# Patient Record
Sex: Male | Born: 1945 | Hispanic: No | Marital: Married
Health system: Southern US, Community
[De-identification: ages and names within clinical notes are randomized; demographics above are authoritative.]

## PROBLEM LIST (undated history)

## (undated) ENCOUNTER — Emergency Department (HOSPITAL_COMMUNITY): Discharge status: Against Medical Advice | Payer: Self-pay

---

## 2006-10-15 ENCOUNTER — Emergency Department (HOSPITAL_COMMUNITY): Admission: EM | Admit: 2006-10-15 | Discharge: 2006-10-15 | Payer: Self-pay | Admitting: Emergency Medicine

## 2008-01-10 ENCOUNTER — Inpatient Hospital Stay (HOSPITAL_COMMUNITY): Admission: EM | Admit: 2008-01-10 | Discharge: 2008-01-14 | Payer: Self-pay | Admitting: Emergency Medicine

## 2009-04-01 DEATH — deceased

## 2010-05-21 IMAGING — CR DG CHEST 2V SAME DAY
2 series · 2 of 2 positions shown · non-contrast
Comparison: 01/12/2008.

CLINICAL DATA: Shortness breath.  Fever.  End-stage renal disease.

CHEST - 2 VIEW SAME DAY

[w chest pa]
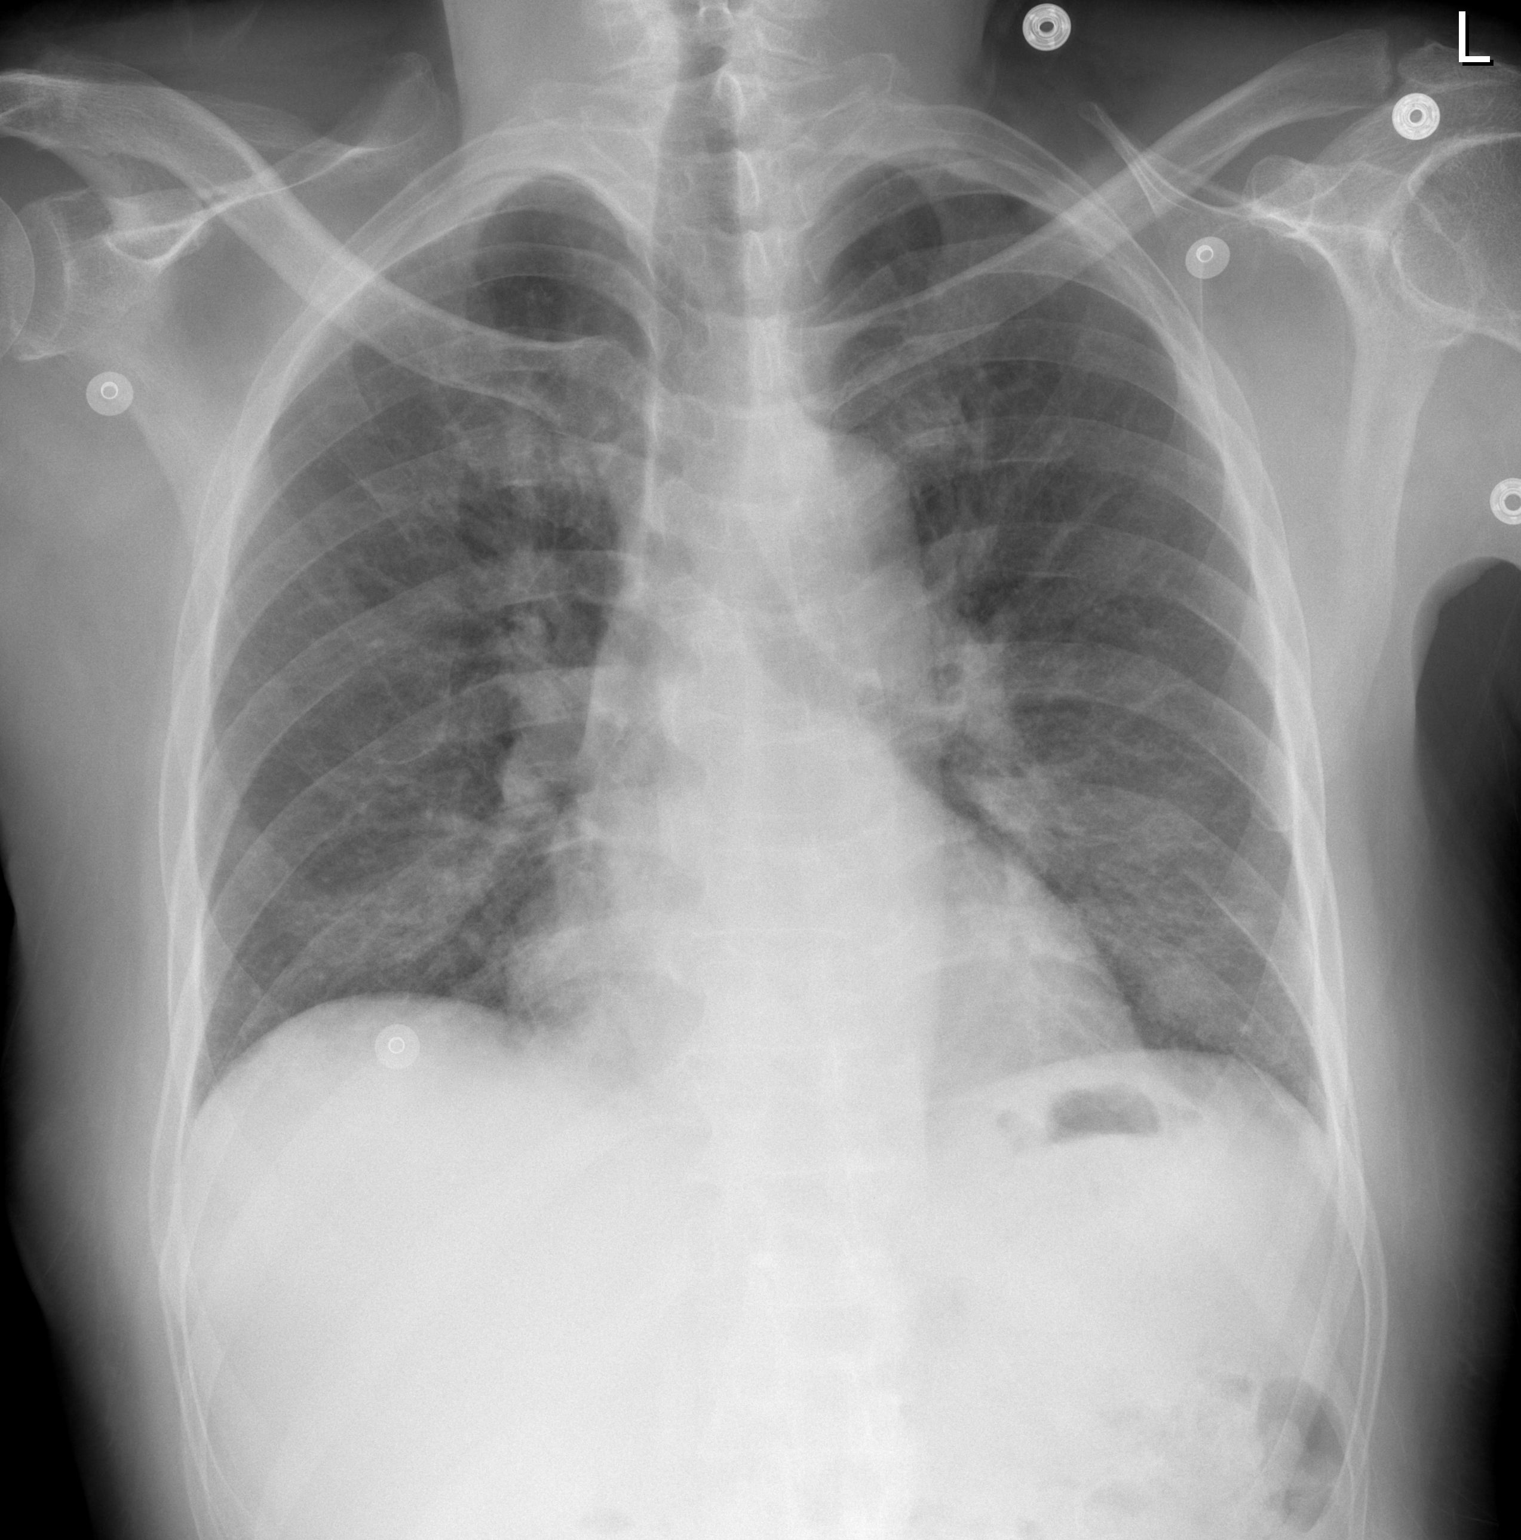

[w chest lat]
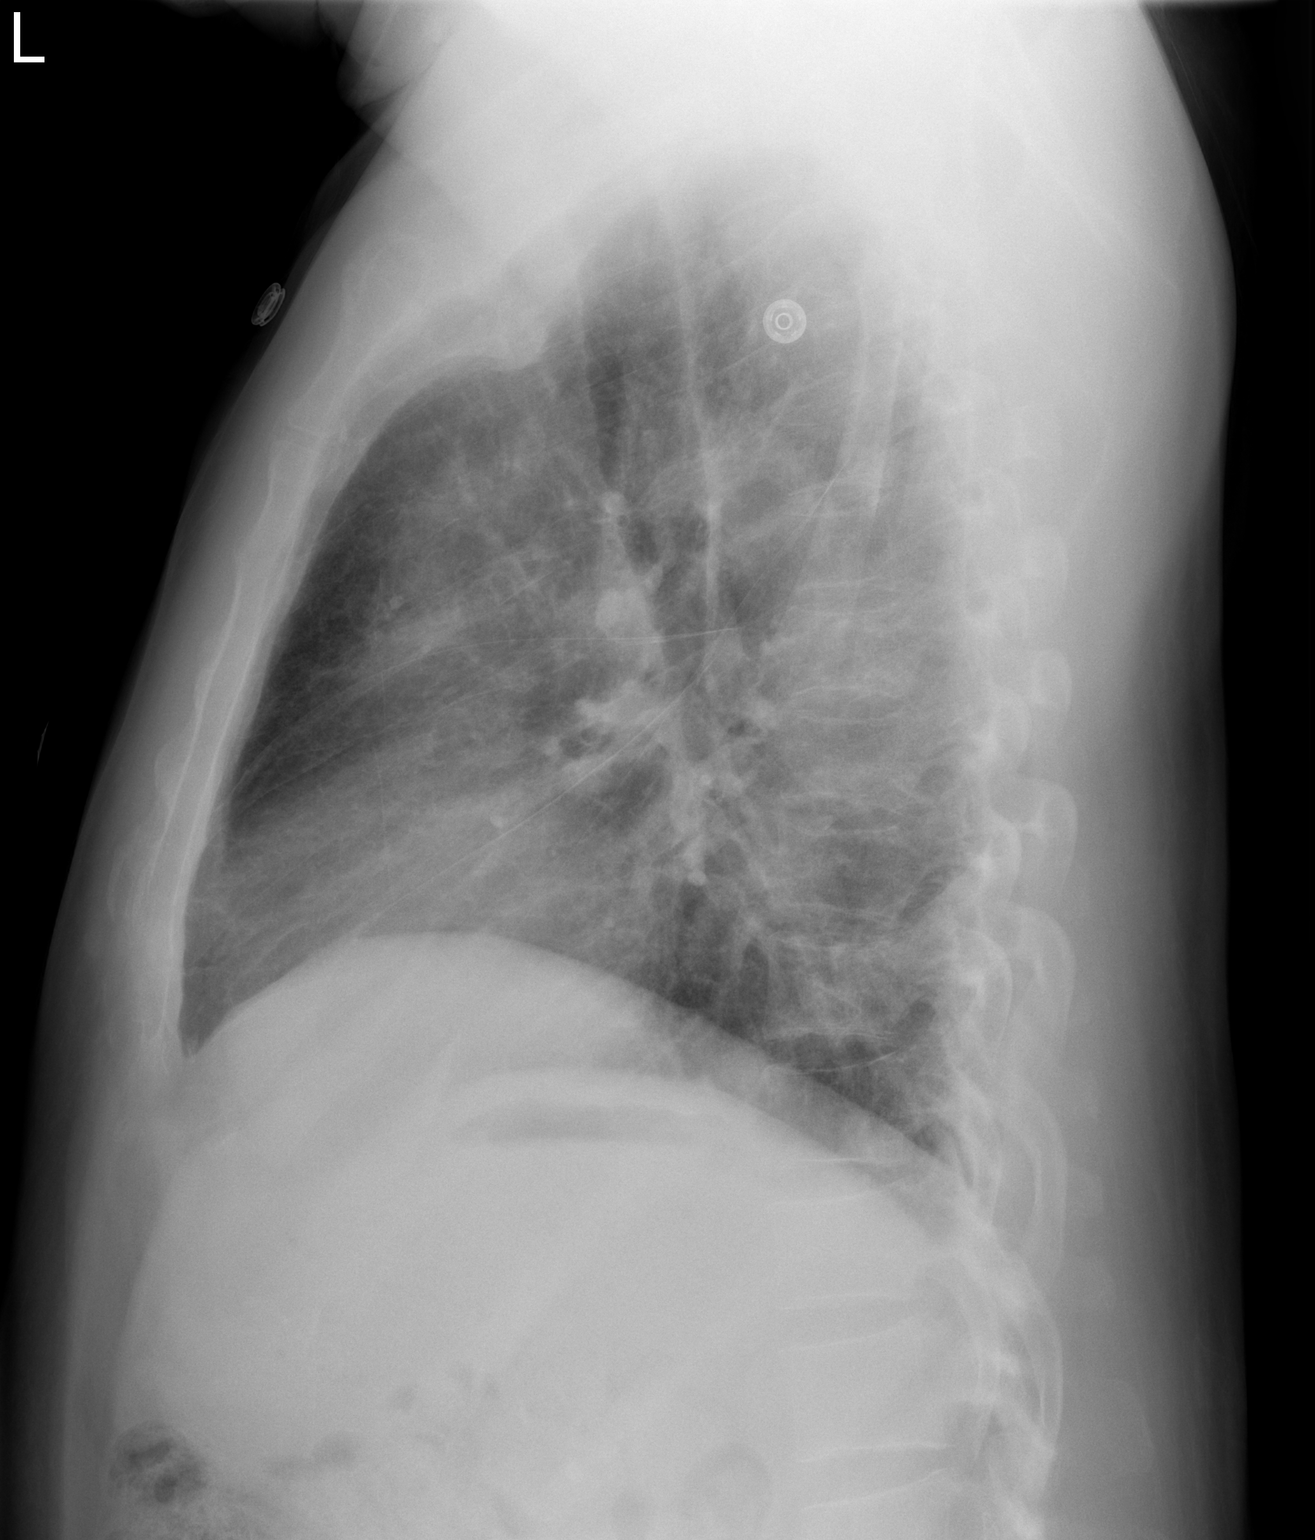

[2 of 2 positions shown; findings below may reference images not displayed]

FINDINGS: Minimal patchiness lower lobe regions.  Subtle infectious
infiltrates superimposed on chronic markings and pulmonary vascular
congestion cannot be excluded.  This can be evaluated follow-up.
Heart is slightly enlarged.
IMPRESSION: Subtle patchiness lower lung zones.  Early infiltrate cannot be
excluded.

## 2010-09-14 NOTE — H&P (Signed)
NAME:  Walter Cunningham, Walter Cunningham NO.:  [id number]   MEDICAL RECORD NO.:  [id number]          PATIENT TYPE:  INP   LOCATION:  6735                         FACILITY:  [facility]   PHYSICIAN:  Peggye Pitt, M.D. DATE OF BIRTH:  01/14/1946   DATE OF ADMISSION:  01/10/2008  DATE OF DISCHARGE:                              HISTORY & PHYSICAL   PRIMARY CARE PHYSICIAN:  He is unassigned to [place].  He is visiting from  [place].   CHIEF COMPLAINT:  Shortness of breath.   HISTORY OF PRESENT ILLNESS:  Walter Cunningham is a 65 year old African  American man with history of end-stage renal disease on hemodialysis who  is here visiting his son who has been very sick in the ICU.  He has not  had a hemodialysis session in a week and he was transferred from the ICU  where he was visiting his son to the emergency department today  secondary to shortness of breath.  He does not have any chest pain or  any cough, abdominal pain, or any other symptoms.  In the ED, he was  found to have a fever of 101.0.   ALLERGIES:  He stated allergies to SULFA drugs and to PENICILLIN, both  of which give him a rash.   PAST MEDICAL HISTORY:  1. End-stage renal disease on hemodialysis.  2. Hypertension.  3. Type 2 diabetes.  4. He does have other medical problems that he dose not recall at this      time.   HOME MEDICATIONS:  We are unsure of what he is on at this time and Dr.  Darrick Penna is going to get his hemodialysis records from [place] in the  morning and that should hopefully help [place] with his medications.   SOCIAL HISTORY:  He denies any alcohol, tobacco, or illicit drug use.  He is retired.  He is from [place] and he is here as stated above,  visiting his son who is in the ICU.   FAMILY HISTORY:  Noncontributory.   REVIEW OF SYSTEMS:  A 10-point review of systems is performed and is  negative except as per HPI.   PHYSICAL EXAMINATION:  VITAL SIGNS:  Upon admission, blood pressure  150/67, heart  rate 77, respirations 16, and O2 sats 84% on room air with  a temperature of 101.  GENERAL:  He is alert, awake, oriented x3, currently he is not in any  distress.  He has a 100% nonrebreather on with O2 sats of 98%.  HEENT:  Normocephalic, atraumatic.  His pupils are equally reactive to  light and accommodation with intact extraocular movement.  NECK:  Supple with no JVD, no lymphadenopathy.  No bruits or goiter.  HEART:  Regular rate and rhythm with no murmurs, rubs, or gallops  auscultated.  LUNGS:  Very rhonchorous with bibasilar crackles.  No wheezes  auscultated.  ABDOMEN:  Obese, soft, nontender, and nondistended with positive bowel  sounds.  EXTREMITIES:  With 2-3+ edema up to his knees bilaterally.   LABORATORY DATA:  Upon admission, sodium 140, potassium 4.7, chloride  110, bicarb 25, BUN 70, creatinine of 10.5, and glucose  of 184.  WBC is  9.9, hemoglobin 11.0, and platelets of 193.  A chest x-ray is consistent  with pulmonary edema, but they also question of multifocal pneumonia.   ASSESSMENT AND PLAN:  1. Shortness of breath.  Chest x-ray is consistent with pulmonary      edema, which is likely secondary to volume overload from his lack      of hemodialysis.  Dr. Darrick Penna with Nephrology has been consulted      and they plan for dialysis in the morning once they receive his      records from [place].  There is also a question of pneumonia and      with his temperature, we will start on vancomycin and Avelox given      his PENICILLIN allergy.  We will also place on droplet precautions      and check an H1N1, PCR.  2. For his fever, most likely secondary to pneumonia.  He does produce      urine, so we will also check a urinalysis.  We will obtain blood      culture, sputum culture, and urine cultures.  3. End-stage renal disease.  Dr. Darrick Penna with [facility] to see.  4. Hypertension.  We will await medication list from [place] and we      will  start antihypertensive at that time.  5. For his type 2 diabetes, we will check hemoglobin A1c and placed on      a sliding scale insulin while in the hospital.  6. Prophylaxis.  We will place him on Protonix for gastrointestinal      prophylaxis and on subcutaneous heparin for DVT prophylaxis.      Peggye Pitt, M.D.  Electronically Signed     EH/MEDQ  D:  01/10/2008  T:  01/11/2008  Job:  [id number]

## 2010-09-14 NOTE — Discharge Summary (Signed)
NAME:  Walter, Cunningham NO.:  [id number]   MEDICAL RECORD NO.:  [id number]          PATIENT TYPE:  INP   LOCATION:  6735                         FACILITY:  [facility]   PHYSICIAN:  Peggye Pitt, M.D. DATE OF BIRTH:  1946-03-27   DATE OF ADMISSION:  01/10/2008  DATE OF DISCHARGE:  01/14/2008                               DISCHARGE SUMMARY   DISCHARGE DIAGNOSES:  1. Volume overload secondary to lack of hemodialysis.  2. End-stage renal disease on hemodialysis, Tuesday, Thursday, and      Saturday in [place].  3. Hypertension.  4. Pneumonia.  5. Diabetes.   DISCHARGE MEDICATIONS:  1. Avelox 400 mg p.o. daily for 5 more days to complete a 10-day      course.  2. Lipitor 80 mg p.o. nightly.  3. Zoloft 25 mg p.o. daily.  4. PhosLo 667 mg t.i.d. with meals.  5. Timolol 0.25% eye drops in both eyes twice daily.  6. Humalog sliding scale.  7. Nepro 1 can daily.  8. Plavix 75 mg p.o. daily.  9. Procardia XL 90 mg p.o. daily.  10.Flomax 0.4 mg p.o. daily.  11.Minoxidil 2.5 mg 2 tabs p.o. b.i.d.  12.Vasotec 20 mg p.o. b.i.d.  13.Creatine 2 caplets t.i.d. with meals.  14.Norvasc 10 mg p.o. daily.  15.Prevacid 20 mg p.o. daily.  16.Toprol-XL 100 mg p.o. b.i.d.  17.Xanax 0.25 mg p.o. b.i.d. p.r.n.  18.Lyrica 150 mg p.o. b.i.d.   DISPOSITION AND FOLLOWUP:  The patient will be discharged today after  his hemodialysis session to attend his son's funeral on Wednesday.  He  will then leave for  [place] to receive his hemodialysis session on  Thursday, which will be set up by Nephrology.  We greatly appreciate  their assistance.   CONSULTATIONS DURING THIS HOSPITALIZATION:  James L. Deterding, MD and  [facility] Salvia. Eliott Nine, MD, with [facility].   IMAGES DURING THIS HOSPITALIZATION:  Chest x-ray on January 10, 2008,  consistent with pulmonary edema, but multifocal pneumonia could also  have a similar appearance.  A chest x-ray as well on January 12, 2008,  that  shows a subtle patchiness in the lower lung zones, but an early  infiltrate cannot be excluded.   HISTORY AND PHYSICAL EXAM:  For full details, please refer to history  and physical exam dictated on date January 10, 2008, but in brief, Mr.  Walter Cunningham is a 65 year old African-American man with a history of end-  stage renal disease on hemodialysis who was here visiting his son who  was very ill in the ICU.  Unfortunately, he has since passed away.  Because he has been here in [place] and he was in [place], he has  missed his hemodialysis sessions for the past week and presented with  acute shortness of breath and a fever with a chest x-ray most consistent  with pulmonary edema, but also pneumonia could not be excluded.   HOSPITAL COURSE:  1. His volume overload secondary to lack of hemodialysis.  We      consulted Nephrology, and they have graciously assisted in      hemodialyzing Mr. Karl.  He  is receiving his last hemodialysis      session here today on Monday.  He will be discharged home with      close followup with his Outpatient Dialysis Center in [place] on      Thursday.  2. Hypertension.  Upon discharge, his blood pressure is acceptable,      although not at goal, but this can certainly be adjusted as an      outpatient.  3. Pneumonia with a fever of 101, upon admission.  Blood cultures did      not show any growth.  He was placed on vancomycin and Zosyn upon      arrival to the hospital.  Vancomycin has been discontinued today,      so he has received 5 doses of that.  We will also place him on      Avelox 400 mg p.o. daily, and he will continue this for 5 more days      to complete a 10-day course.  4. Type 2 diabetes.  He was on 5 units of Lantus at home which we did      start while in the hospital, but this was discontinued secondary to      hypoglycemia.  We have kept him only on a sliding scale.  We will      discontinue the Lantus upon discharge and that  can be closely      followed up at an outpatient dialysis center.  5. Disposition.  The patient will be discharged today after      hemodialysis and will have his further hemodialysis session at his      outpatient center in [place].   VITAL SIGNS UPON DISCHARGE:  Blood pressure 143/65, heart rate 74,  respirations 20, and O2 sats 95% on room air with a temperature of 98.5.   LABORATORIES UPON DISCHARGE:  Sodium 141, potassium 4.1, chloride 106,  bicarb 26, BUN 40, creatinine 7.2, and blood glucose of 66.  WBC 6.6,  hemoglobin 10.3 with an MCV of 87.3, and platelets of 226.      Peggye Pitt, M.D.  Electronically Signed     EH/MEDQ  D:  01/14/2008  T:  01/15/2008  Job:  [id number]   cc:   [facility] Salvia. Eliott Nine, M.D.  James L. Deterding, M.D.

## 2010-09-14 NOTE — Consult Note (Signed)
NAME:  Walter Cunningham, LITT NO.:  [id number]   MEDICAL RECORD NO.:  [id number]          PATIENT TYPE:  INP   LOCATION:  1828                         FACILITY:  [facility]   PHYSICIAN:  James L. Deterding, M.D.DATE OF BIRTH:  25-Jan-1946   DATE OF CONSULTATION:  DATE OF DISCHARGE:                                 CONSULTATION   REASON FOR CONSULTATION:  1. Congestive heart failure.  2. End-stage renal disease, apparently dialyzes somewhere in [place].  3. Hypertension.   HISTORY OF PRESENT ILLNESS:  This is a 65 year old gentleman with over  26-year history of diabetes mellitus, over 20-year history of  hypertension.  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  He has been on  hemodialysis for about a year at [place], [place], [address]  He initially had a catheter in his chest, dialyzes  now and has a left upper arm fistula.  He says he dialyzed Tuesday,  Thursday, and Saturday in [place], [place].  Has not been dialyzed  in over a week.  Missed dialysis because he was moving for couple of  days and then his son got sick so he came to [place], but he  never called this unit to make arrangements or anything else for that.  He is a very diffuse historian, will not answer questions directly.  He  came to emergency room because he missed dialysis.  He denies shortness  of breath, PND, orthopnea, chest pain, edema, nausea, and vomiting.  He  denies any history of hepatitis.  No cough, fever, chills, nausea, or  vomiting.  Here he has temperature of 101.   PAST MEDICAL HISTORY:  As listed above.   MEDICATIONS:  He does not know his medicines.  He is on insulin.  He  uses a sliding scale, he does not know what kind of insulin.  He has  pills, but he does not have bottles with him.  He has some pill boxes.  Those  were looked at and those were vitamin and a number of other  medicines and one of them is calcium, but does not know what kind of  calcium.   PAST OPERATIONS:  He had fistulas, catheters.  He had several back  operations and a hernia operation.   ALLERGIES:  PENICILLIN and SULFA.   REVIEW OF SYSTEMS:  HEENT:  Cannot see very well from the diabetes.  No  hearing difficulties.  No headaches.  He is weak on his right side  compared to his left side.  PULMONARY:  He denies cough, sputum  production.  He used to smoke until about 10 years ago.  GI:  Denies  nausea, vomiting, or diarrhea.  Denies hepatitis.  Denies jaundice,  indigestion, or heart burn.  He used to drink fairly heavily, but has  not for a number of years.  MUSCULOSKELETAL:  Some back pain, and he has  arthritis in multiple joints he says but not severe.  SKIN:  Unremarkable.  NEUROLOGIC:  Right-sided weakness and visual  difficulties.  He says he does not make any urine at the current time.   OBJECTIVE:  VITAL SIGNS:  Temperature is 101; blood pressure 160/87 now,  it was 190/99 earlier; heart rate 94; respiratory rate 16; no acute  distress; sats 98% on 3L.  HEENT:  Diabetic retinopathy.  Pharynx is unremarkable.  Ears  unremarkable.  NECK:  Without masses or thyromegaly.  LUNGS:  Crackles in both bases through the mid lungs.  Decreased breath  sounds.  Decreased expansion.  ABDOMEN:  Positive bowel sounds.  Liver is down 4 cm.  Abdomen is soft  and nontender.  SKIN:  Without consistent rashes and atrophic changes on his feet.  He  does have 2+ peripheral edema.  CARDIOVASCULAR:  Regular rhythm.  S4, grade 2/6 pulse felt and heard  best at the apex, 2+ edema.  PMI __________.  AV fistula, left upper  arm.  NEUROLOGIC:  Strength on the left is 4+/5, right is 4/5.  Deep tendon  reflexes are 1+ on the left and 2+ on the right.  Slight right facial  droop.  No other cranial nerve abnormalities can be detected and the   patient was not checked very closely.   Hemoglobin 11, white count 9900, platelets 193,000.  Sodium 140,  potassium 4.7, chloride 110, creatinine 10.5, BUN of 70, glucose 184.  Chest x-ray, CHF is moderate.   ASSESSMENT:  1. Chronic renal failure with volume excess at this time, will need      hemodialysis, not emergently, now he is on oxygen.  We will try to      get some records and get his hepatitis status at this time.  2. Hypertension.  We will decrease his volume loss.  He is on Norvasc      and p.r.n. clonidine.  3. Diabetes.  He uses sliding scale insulin.  4. Coronary artery disease, inactive at this time.  Obviously we would      like to get his volume down for him.  5. Cerebrovascular accidents.  6. Fever, cultures and he is on antibiotics at this time.  Question of      pulmonary source.  It is not really clear that is what the source      is.   PLAN:  1. Check hepatitis status and get records.  2. Hemodialysis.  3. Sliding scale insulin.  4. Norvasc and clonidine.  5. Get records.  6. Antibiotics after cultures.           ______________________________  Llana Aliment Deterding, M.D.     JLD/MEDQ  D:  01/10/2008  T:  01/11/2008  Job:  [id number]

## 2010-09-17 NOTE — Discharge Summary (Signed)
NAME:  Walter Cunningham, Walter Cunningham NO.:  [id number]   MEDICAL RECORD NO.:  [id number]          PATIENT TYPE:  INP   LOCATION:  6735                         FACILITY:  [facility]   PHYSICIAN:  Theodosia Paling, MD    DATE OF BIRTH:  1945/11/13   DATE OF ADMISSION:  01/10/2008  DATE OF DISCHARGE:  01/14/2008                               DISCHARGE SUMMARY   ADDENDUM REPORT   DISCHARGE MEDICATIONS:  DioVite one tablet p.o. daily.      Peggye Pitt, M.D.  Electronically Signed      Theodosia Paling, MD  Electronically Signed    EH/MEDQ  D:  02/12/2008  T:  02/12/2008  Job:  [id number]

## 2011-02-02 LAB — DIFFERENTIAL
Basophils Relative: 0
Lymphocytes Relative: 10 — ABNORMAL LOW
Monocytes Relative: 5
Neutro Abs: 8.3 — ABNORMAL HIGH
Neutrophils Relative %: 84 — ABNORMAL HIGH

## 2011-02-02 LAB — COMPREHENSIVE METABOLIC PANEL
ALT: 39
AST: 18
Albumin: 2.5 — ABNORMAL LOW
Albumin: 2.6 — ABNORMAL LOW
BUN: 30 — ABNORMAL HIGH
CO2: 24
CO2: 28
Calcium: 7.8 — ABNORMAL LOW
Calcium: 8.4
Chloride: 104
Chloride: 105
Creatinine, Ser: 10.71 — ABNORMAL HIGH
Creatinine, Ser: 5.79 — ABNORMAL HIGH
Creatinine, Ser: 7.06 — ABNORMAL HIGH
GFR calc non Af Amer: 5 — ABNORMAL LOW
Glucose, Bld: 104 — ABNORMAL HIGH
Glucose, Bld: 230 — ABNORMAL HIGH
Sodium: 142
Total Bilirubin: 0.8
Total Protein: 6.3
Total Protein: 6.8

## 2011-02-02 LAB — PHOSPHORUS
Phosphorus: 3.8
Phosphorus: 4.2

## 2011-02-02 LAB — GLUCOSE, CAPILLARY
Glucose-Capillary: 117 — ABNORMAL HIGH
Glucose-Capillary: 188 — ABNORMAL HIGH
Glucose-Capillary: 221 — ABNORMAL HIGH
Glucose-Capillary: 222 — ABNORMAL HIGH
Glucose-Capillary: 81
Glucose-Capillary: 85

## 2011-02-02 LAB — RENAL FUNCTION PANEL
Albumin: 2.5 — ABNORMAL LOW
BUN: 40 — ABNORMAL HIGH
CO2: 27
Calcium: 8 — ABNORMAL LOW
Chloride: 105
Chloride: 106
GFR calc Af Amer: 10 — ABNORMAL LOW
GFR calc non Af Amer: 8 — ABNORMAL LOW
Glucose, Bld: 66 — ABNORMAL LOW
Potassium: 4.1
Sodium: 142

## 2011-02-02 LAB — CULTURE, BLOOD (SINGLE): Culture: NO GROWTH

## 2011-02-02 LAB — LIPID PANEL
Cholesterol: 131
Cholesterol: 151
HDL: 32 — ABNORMAL LOW
HDL: 34 — ABNORMAL LOW
LDL Cholesterol: 100 — ABNORMAL HIGH
Total CHOL/HDL Ratio: 4.4

## 2011-02-02 LAB — CBC
HCT: 28.3 — ABNORMAL LOW
HCT: 28.4 — ABNORMAL LOW
HCT: 30.3 — ABNORMAL LOW
HCT: 32.6 — ABNORMAL LOW
Hemoglobin: 10.3 — ABNORMAL LOW
Hemoglobin: 9.5 — ABNORMAL LOW
Hemoglobin: 9.9 — ABNORMAL LOW
MCHC: 33.5
MCHC: 34
MCHC: 34.9
MCV: 86.7
MCV: 90.1
Platelets: 184
RBC: 3.28 — ABNORMAL LOW
RBC: 3.64 — ABNORMAL LOW
RDW: 14
RDW: 14.2
WBC: 7.1
WBC: 9.9

## 2011-02-02 LAB — POCT I-STAT, CHEM 8
Calcium, Ion: 0.96 — ABNORMAL LOW
Glucose, Bld: 184 — ABNORMAL HIGH
HCT: 31 — ABNORMAL LOW
Hemoglobin: 10.5 — ABNORMAL LOW
Potassium: 4.7

## 2011-02-02 LAB — HEMOGLOBIN A1C: Hgb A1c MFr Bld: 7.8 — ABNORMAL HIGH

## 2011-02-02 LAB — IRON AND TIBC
Saturation Ratios: 14 — ABNORMAL LOW
TIBC: 139 — ABNORMAL LOW

## 2011-02-02 LAB — CULTURE, BLOOD (ROUTINE X 2): Culture: NO GROWTH

## 2015-03-18 ENCOUNTER — Telehealth (HOSPITAL_COMMUNITY): Payer: Self-pay

## 2015-03-19 NOTE — Telephone Encounter (Signed)
Opened in error
